# Patient Record
Sex: Female | Born: 1980 | Race: White | Hispanic: No | Marital: Married | Smoking: Current every day smoker
Health system: Southern US, Community
[De-identification: ages and names within clinical notes are randomized; demographics above are authoritative.]

## PROBLEM LIST (undated history)

## (undated) DIAGNOSIS — F1410 Cocaine abuse, uncomplicated: Secondary | ICD-10-CM

## (undated) DIAGNOSIS — F329 Major depressive disorder, single episode, unspecified: Secondary | ICD-10-CM

## (undated) DIAGNOSIS — F32A Depression, unspecified: Secondary | ICD-10-CM

## (undated) DIAGNOSIS — Z720 Tobacco use: Secondary | ICD-10-CM

## (undated) DIAGNOSIS — F419 Anxiety disorder, unspecified: Secondary | ICD-10-CM

## (undated) DIAGNOSIS — F1910 Other psychoactive substance abuse, uncomplicated: Secondary | ICD-10-CM

## (undated) DIAGNOSIS — Z8619 Personal history of other infectious and parasitic diseases: Secondary | ICD-10-CM

## (undated) HISTORY — DX: Tobacco use: Z72.0

## (undated) HISTORY — DX: Cocaine abuse, uncomplicated: F14.10

## (undated) HISTORY — DX: Other psychoactive substance abuse, uncomplicated: F19.10

## (undated) HISTORY — DX: Anxiety disorder, unspecified: F41.9

## (undated) HISTORY — DX: Major depressive disorder, single episode, unspecified: F32.9

## (undated) HISTORY — DX: Personal history of other infectious and parasitic diseases: Z86.19

## (undated) HISTORY — DX: Depression, unspecified: F32.A

---

## 2007-05-09 HISTORY — PX: AUGMENTATION MAMMAPLASTY: SUR837

## 2017-04-18 ENCOUNTER — Ambulatory Visit (INDEPENDENT_AMBULATORY_CARE_PROVIDER_SITE_OTHER): Admitting: Physician Assistant

## 2017-04-18 ENCOUNTER — Other Ambulatory Visit (HOSPITAL_COMMUNITY)
Admission: RE | Admit: 2017-04-18 | Discharge: 2017-04-18 | Disposition: A | Discharge status: Home / Self Care | Source: Ambulatory Visit | Attending: Physician Assistant | Admitting: Physician Assistant

## 2017-04-18 ENCOUNTER — Telehealth: Payer: Self-pay

## 2017-04-18 ENCOUNTER — Encounter: Payer: Self-pay | Admitting: Physician Assistant

## 2017-04-18 ENCOUNTER — Encounter (INDEPENDENT_AMBULATORY_CARE_PROVIDER_SITE_OTHER): Payer: Self-pay

## 2017-04-18 VITALS — BP 102/68 | HR 68 | Ht 61.0 in | Wt 124.0 lb

## 2017-04-18 DIAGNOSIS — F331 Major depressive disorder, recurrent, moderate: Secondary | ICD-10-CM

## 2017-04-18 DIAGNOSIS — Z87898 Personal history of other specified conditions: Secondary | ICD-10-CM | POA: Diagnosis not present

## 2017-04-18 DIAGNOSIS — Z202 Contact with and (suspected) exposure to infections with a predominantly sexual mode of transmission: Secondary | ICD-10-CM

## 2017-04-18 DIAGNOSIS — Z23 Encounter for immunization: Secondary | ICD-10-CM | POA: Diagnosis not present

## 2017-04-18 DIAGNOSIS — Z124 Encounter for screening for malignant neoplasm of cervix: Secondary | ICD-10-CM

## 2017-04-18 DIAGNOSIS — F1411 Cocaine abuse, in remission: Secondary | ICD-10-CM

## 2017-04-18 DIAGNOSIS — Z7721 Contact with and (suspected) exposure to potentially hazardous body fluids: Secondary | ICD-10-CM

## 2017-04-18 DIAGNOSIS — A599 Trichomoniasis, unspecified: Secondary | ICD-10-CM

## 2017-04-18 DIAGNOSIS — T7421XA Adult sexual abuse, confirmed, initial encounter: Secondary | ICD-10-CM

## 2017-04-18 DIAGNOSIS — Z3202 Encounter for pregnancy test, result negative: Secondary | ICD-10-CM

## 2017-04-18 DIAGNOSIS — F418 Other specified anxiety disorders: Secondary | ICD-10-CM

## 2017-04-18 DIAGNOSIS — Z8742 Personal history of other diseases of the female genital tract: Secondary | ICD-10-CM | POA: Insufficient documentation

## 2017-04-18 DIAGNOSIS — A749 Chlamydial infection, unspecified: Secondary | ICD-10-CM | POA: Diagnosis not present

## 2017-04-18 DIAGNOSIS — A568 Sexually transmitted chlamydial infection of other sites: Secondary | ICD-10-CM

## 2017-04-18 DIAGNOSIS — Z111 Encounter for screening for respiratory tuberculosis: Secondary | ICD-10-CM

## 2017-04-18 DIAGNOSIS — Z7689 Persons encountering health services in other specified circumstances: Secondary | ICD-10-CM

## 2017-04-18 DIAGNOSIS — R87619 Unspecified abnormal cytological findings in specimens from cervix uteri: Secondary | ICD-10-CM

## 2017-04-18 LAB — POCT URINE PREGNANCY: Preg Test, Ur: NEGATIVE

## 2017-04-18 MED ORDER — AZITHROMYCIN 1 G PO PACK: 1.0000 g | PACK | Freq: Once | ORAL | 0 refills | Status: DC

## 2017-04-18 MED ORDER — ESCITALOPRAM OXALATE 10 MG PO TABS: ORAL_TABLET | ORAL | 0 refills | Status: DC

## 2017-04-18 MED ORDER — HYDROXYZINE HCL 25 MG PO TABS: 25.0000 mg | ORAL_TABLET | Freq: Three times a day (TID) | ORAL | 0 refills | Status: DC | PRN

## 2017-04-18 MED ORDER — TUBERCULIN PPD 5 UNIT/0.1ML ID SOLN
5.0000 [IU] | Freq: Once | INTRADERMAL | Status: AC
  Administered 2017-04-18: 5 [IU] via INTRADERMAL

## 2017-04-18 MED ORDER — CEFTRIAXONE SODIUM 250 MG IJ SOLR: 250.0000 mg | Freq: Once | INTRAMUSCULAR | 0 refills | Status: DC

## 2017-04-18 MED ORDER — AZITHROMYCIN 250 MG PO TABS
1000.0000 mg | ORAL_TABLET | Freq: Once | ORAL | Status: AC
  Administered 2017-04-18: 1000 mg via ORAL

## 2017-04-18 MED ORDER — CEFTRIAXONE SODIUM 250 MG IJ SOLR
250.0000 mg | Freq: Once | INTRAMUSCULAR | Status: AC
  Administered 2017-04-18: 250 mg via INTRAMUSCULAR

## 2017-04-18 NOTE — Telephone Encounter (Signed)
[facility] Estelle Junehonda Jaquasia Doscher,CMA

## 2017-04-18 NOTE — Patient Instructions (Addendum)
  [facility] [address] [place], [place] [address] Administrative Line: [phone](336) [phone] or [phone](800) [phone] Crisis Line(s): [phone](336) [phone] (SA) or [phone](336) [phone] (DV) Fax: [phone](336) [phone] Website: http://[email or web address]  [place] [facility] [address] [place], [place] [address] Administrative Line: [phone](336) [phone] Crisis Line(s): [phone](336) [phone] Fax: [phone](336) [phone] Website: www.fspcares.Ronney Astersorg  National Sexual Assault Hotline Call 71242210491-(506)278-0039  Www.rainn.org  [facility] If you have a substance abuse disorder (with or without a mental health condition), you may benefit from specialized substance abuse therapy in a group setting. According to discharge survey data, 70 percent of patients completing our chemical dependency intensive outpatient program report fewer symptoms of substance abuse and incidents of relapse.  Meet with your peers every Monday, Wednesday and Friday from 1 to 4 p.m. for six to 10 weeks to:  Learn about chemical dependency, mental illness and co-occurring disorders. Develop relapse-prevention skills. Set personalized goals with your treatment team. To build on the skills you gain, you can attend Alcoholics Anonymous or Narcotics Anonymous meetings in the evenings and access follow-up care through weekly group meetings with peers. Ongoing support promotes wellness and recovery.  For more information, call [phone]. We work directly with employers and families to ensure you receive the care you need.

## 2017-04-18 NOTE — Telephone Encounter (Signed)
[facility] but they are not sure how long the back order will last. Left message that the [facility] [address]

## 2017-04-18 NOTE — Telephone Encounter (Signed)
Tell the patient to choose a different pharmacy
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
## 2017-04-18 NOTE — Telephone Encounter (Signed)
[facility] called and states the hydroxyzine is on manufacture back order and will not be able to get this medication for a while. Please advise.

## 2017-04-19 ENCOUNTER — Encounter: Payer: Self-pay | Admitting: Physician Assistant

## 2017-04-19 DIAGNOSIS — F17200 Nicotine dependence, unspecified, uncomplicated: Secondary | ICD-10-CM | POA: Insufficient documentation

## 2017-04-19 DIAGNOSIS — F331 Major depressive disorder, recurrent, moderate: Secondary | ICD-10-CM | POA: Insufficient documentation

## 2017-04-19 LAB — HIV ANTIBODY (ROUTINE TESTING W REFLEX): HIV 1&2 Ab, 4th Generation: NONREACTIVE

## 2017-04-19 LAB — HEPATITIS C ANTIBODY
HEP C AB: NONREACTIVE
SIGNAL TO CUT-OFF: 0.02 (ref <1.00)

## 2017-04-19 LAB — RPR: RPR: NONREACTIVE

## 2017-04-20 ENCOUNTER — Ambulatory Visit (INDEPENDENT_AMBULATORY_CARE_PROVIDER_SITE_OTHER): Admitting: Physician Assistant

## 2017-04-20 VITALS — BP 110/64 | HR 68 | Ht 61.0 in | Wt 124.0 lb

## 2017-04-20 DIAGNOSIS — Z111 Encounter for screening for respiratory tuberculosis: Secondary | ICD-10-CM

## 2017-04-20 LAB — CYTOLOGY - PAP
Chlamydia: POSITIVE — AB
Diagnosis: NEGATIVE
HPV (WINDOPATH): NOT DETECTED
Neisseria Gonorrhea: POSITIVE — AB

## 2017-04-20 LAB — READ PPD: TB SKIN TEST: NEGATIVE

## 2017-04-20 NOTE — Progress Notes (Signed)
Patient was in the office for PPD reading. There was no signs of irritation or raised bump at the injection site. Niamh Rada,CMA

## 2017-04-24 ENCOUNTER — Telehealth: Payer: Self-pay | Admitting: Physician Assistant

## 2017-04-24 ENCOUNTER — Ambulatory Visit

## 2017-04-24 DIAGNOSIS — F331 Major depressive disorder, recurrent, moderate: Secondary | ICD-10-CM

## 2017-04-24 DIAGNOSIS — F418 Other specified anxiety disorders: Secondary | ICD-10-CM

## 2017-04-24 MED ORDER — HYDROXYZINE HCL 25 MG PO TABS: 25.0000 mg | ORAL_TABLET | Freq: Three times a day (TID) | ORAL | 3 refills | Status: DC | PRN

## 2017-04-24 MED ORDER — ESCITALOPRAM OXALATE 20 MG PO TABS: 20.0000 mg | ORAL_TABLET | Freq: Every day | ORAL | 3 refills | Status: DC

## 2017-04-24 NOTE — Telephone Encounter (Signed)
Done

## 2017-04-24 NOTE — Telephone Encounter (Signed)
Rx given to Pt.

## 2017-04-24 NOTE — Telephone Encounter (Signed)
Pt is to start a drug rehab program tomorrow morning [facility] [place]. She is requesting a 4 month supply on her medications (lexapro,hydroxyzine), printed.   Called the facility, spoke with [place]. She talked with the intake representative and was advised they "could not confirm or deny if the Pt is coming in for treatment" but the Pt would need a printed 30 day Rx with enough refills to last 4 months.  Will route to Provider in office for review.

## 2017-04-25 DIAGNOSIS — A568 Sexually transmitted chlamydial infection of other sites: Secondary | ICD-10-CM | POA: Insufficient documentation

## 2017-04-25 DIAGNOSIS — Z202 Contact with and (suspected) exposure to infections with a predominantly sexual mode of transmission: Secondary | ICD-10-CM | POA: Insufficient documentation

## 2017-04-25 DIAGNOSIS — A599 Trichomoniasis, unspecified: Secondary | ICD-10-CM | POA: Insufficient documentation

## 2017-04-25 MED ORDER — METRONIDAZOLE 500 MG PO TABS: 500.0000 mg | ORAL_TABLET | Freq: Two times a day (BID) | ORAL | 0 refills | Status: DC

## 2017-04-25 NOTE — Progress Notes (Signed)
STI testing was positive for gonorrhea, chlamydia and trichomonas She received the treatment for gonorrhea and chlamydia She will need to take Metronidazole for the trichomonas. [facility]. She should return for a test of cure in 3 weeks  All other testing was negative Pap smear was normal. Recommend repeat Pap smear in 1 year

## 2017-04-25 NOTE — Addendum Note (Signed)
Addended by: Gena FrayUMMINGS, Jaritza Duignan E on: 04/25/2017 01:25 PM   Modules accepted: Orders

## 2017-04-26 ENCOUNTER — Other Ambulatory Visit: Payer: Self-pay | Admitting: *Deleted

## 2017-04-26 DIAGNOSIS — A599 Trichomoniasis, unspecified: Secondary | ICD-10-CM

## 2017-04-26 MED ORDER — METRONIDAZOLE 500 MG PO TABS: 500.0000 mg | ORAL_TABLET | Freq: Two times a day (BID) | ORAL | 0 refills | Status: AC

## 2017-05-02 ENCOUNTER — Other Ambulatory Visit: Payer: Self-pay | Admitting: Physician Assistant

## 2017-05-02 ENCOUNTER — Telehealth: Payer: Self-pay | Admitting: Physician Assistant

## 2017-05-02 DIAGNOSIS — F1990 Other psychoactive substance use, unspecified, uncomplicated: Secondary | ICD-10-CM

## 2017-05-02 NOTE — Telephone Encounter (Signed)
Please inform patient of the following inpatient rehab locations recommended by [facility]. I am happy to refer her to [facility] if she prefers, but wanted her to have this info.   [facility] [address], [place], [place] [phone]  [facility] [address], [place], [place] [phone]) [phone]  [facility] [address], [place], [place] [phone]) [phone]

## 2017-05-02 NOTE — Telephone Encounter (Signed)
Patient called to request a referral for inpatient rehabilitation treatment. Patient stated that the previous facility she chose was too far away and not covered by her insurance. Her insurance requires a referral for these services. She is requesting that a referral be placed to the [facility] at [address], [place], [place] [address] [phone]) [phone] as soon as possible. Please let me know if I can help in any way. Thanks!

## 2017-05-04 ENCOUNTER — Other Ambulatory Visit: Payer: Self-pay | Admitting: Physician Assistant

## 2017-05-04 DIAGNOSIS — F418 Other specified anxiety disorders: Secondary | ICD-10-CM

## 2017-05-09 NOTE — Telephone Encounter (Signed)
Patient has tricare insurance that requires referrals and the referrals have to come from [facility]. I tried to call patient on 05/02/17  but the only number listed is her sister's and she has not returned my phone call as of today. We are going to leave the referral open and see if we are able to contact the patient - CF

## 2017-05-15 ENCOUNTER — Other Ambulatory Visit: Payer: Self-pay | Admitting: Physician Assistant

## 2017-05-25 ENCOUNTER — Other Ambulatory Visit: Payer: Self-pay | Admitting: *Deleted

## 2017-05-25 DIAGNOSIS — N6310 Unspecified lump in the right breast, unspecified quadrant: Secondary | ICD-10-CM

## 2017-05-30 ENCOUNTER — Ambulatory Visit
Admission: RE | Admit: 2017-05-30 | Discharge: 2017-05-30 | Disposition: A | Discharge status: Home / Self Care | Source: Ambulatory Visit | Attending: *Deleted | Admitting: *Deleted

## 2017-05-30 DIAGNOSIS — N6310 Unspecified lump in the right breast, unspecified quadrant: Secondary | ICD-10-CM

## 2017-06-20 ENCOUNTER — Ambulatory Visit (INDEPENDENT_AMBULATORY_CARE_PROVIDER_SITE_OTHER): Admitting: Physician Assistant

## 2017-06-20 ENCOUNTER — Encounter: Payer: Self-pay | Admitting: Physician Assistant

## 2017-06-20 VITALS — BP 105/71 | HR 68 | Temp 98.0°F | Resp 16 | Wt 150.0 lb

## 2017-06-20 DIAGNOSIS — F419 Anxiety disorder, unspecified: Secondary | ICD-10-CM | POA: Diagnosis not present

## 2017-06-20 DIAGNOSIS — J00 Acute nasopharyngitis [common cold]: Secondary | ICD-10-CM | POA: Diagnosis not present

## 2017-06-20 DIAGNOSIS — F3341 Major depressive disorder, recurrent, in partial remission: Secondary | ICD-10-CM

## 2017-06-20 DIAGNOSIS — J22 Unspecified acute lower respiratory infection: Secondary | ICD-10-CM

## 2017-06-20 DIAGNOSIS — Z8619 Personal history of other infectious and parasitic diseases: Secondary | ICD-10-CM | POA: Diagnosis not present

## 2017-06-20 DIAGNOSIS — F418 Other specified anxiety disorders: Secondary | ICD-10-CM

## 2017-06-20 DIAGNOSIS — F5105 Insomnia due to other mental disorder: Secondary | ICD-10-CM

## 2017-06-20 LAB — POCT INFLUENZA A/B
INFLUENZA A, POC: NEGATIVE
Influenza B, POC: NEGATIVE

## 2017-06-20 MED ORDER — ESCITALOPRAM OXALATE 20 MG PO TABS: 20.0000 mg | ORAL_TABLET | Freq: Every day | ORAL | 3 refills | Status: AC

## 2017-06-20 MED ORDER — PREDNISONE 20 MG PO TABS: 40.0000 mg | ORAL_TABLET | Freq: Every day | ORAL | 0 refills | Status: AC

## 2017-06-20 MED ORDER — TRAZODONE HCL 100 MG PO TABS: 100.0000 mg | ORAL_TABLET | Freq: Every evening | ORAL | 2 refills | Status: AC | PRN

## 2017-06-20 MED ORDER — ALBUTEROL SULFATE HFA 108 (90 BASE) MCG/ACT IN AERS: 2.0000 | INHALATION_SPRAY | RESPIRATORY_TRACT | 1 refills | Status: AC | PRN

## 2017-06-20 MED ORDER — GUAIFENESIN ER 600 MG PO TB12: 1200.0000 mg | ORAL_TABLET | Freq: Two times a day (BID) | ORAL | Status: AC

## 2017-06-20 MED ORDER — IPRATROPIUM BROMIDE 0.06 % NA SOLN: 2.0000 | Freq: Four times a day (QID) | NASAL | 0 refills | Status: AC | PRN

## 2017-06-20 NOTE — Patient Instructions (Addendum)
For cough: - Prednisone daily for 5 days - Mucinex twice a day with plenty of water to make cough more productive - Do not suppress cough during the day.  - Okay to take Robitussin PM or Delsym PM at bedtime as needed for cough - Use Albuterol inhaler 2 puffs every 4-6 hours as needed for wheezing/shortness of breath - Drink at least 8-11 glasses of water per day - Reduce cigarettes   Steps to Quit Smoking Smoking tobacco can be bad for your health. It can also affect almost every organ in your body. Smoking puts you and people around you at risk for many serious long-lasting (chronic) diseases. Quitting smoking is hard, but it is one of the best things that you can do for your health. It is never too late to quit. What are the benefits of quitting smoking? When you quit smoking, you lower your risk for getting serious diseases and conditions. They can include:  Lung cancer or lung disease.  Heart disease.  Stroke.  Heart attack.  Not being able to have children (infertility).  Weak bones (osteoporosis) and broken bones (fractures).  If you have coughing, wheezing, and shortness of breath, those symptoms may get better when you quit. You may also get sick less often. If you are pregnant, quitting smoking can help to lower your chances of having a baby of low birth weight. What can I do to help me quit smoking? Talk with your doctor about what can help you quit smoking. Some things you can do (strategies) include:  Quitting smoking totally, instead of slowly cutting back how much you smoke over a period of time.  Going to in-person counseling. You are more likely to quit if you go to many counseling sessions.  Using resources and support systems, such as: ? Agricultural engineer with a Veterinary surgeon. ? Phone quitlines. ? Automotive engineer. ? Support groups or group counseling. ? Text messaging programs. ? Mobile phone apps or applications.  Taking medicines. Some of these  medicines may have nicotine in them. If you are pregnant or breastfeeding, do not take any medicines to quit smoking unless your doctor says it is okay. Talk with your doctor about counseling or other things that can help you.  Talk with your doctor about using more than one strategy at the same time, such as taking medicines while you are also going to in-person counseling. This can help make quitting easier. What things can I do to make it easier to quit? Quitting smoking might feel very hard at first, but there is a lot that you can do to make it easier. Take these steps:  Talk to your family and friends. Ask them to support and encourage you.  Call phone quitlines, reach out to support groups, or work with a Veterinary surgeon.  Ask people who smoke to not smoke around you.  Avoid places that make you want (trigger) to smoke, such as: ? Bars. ? Parties. ? Smoke-break areas at work.  Spend time with people who do not smoke.  Lower the stress in your life. Stress can make you want to smoke. Try these things to help your stress: ? Getting regular exercise. ? Deep-breathing exercises. ? Yoga. ? Meditating. ? Doing a body scan. To do this, close your eyes, focus on one area of your body at a time from head to toe, and notice which parts of your body are tense. Try to relax the muscles in those areas.  Download or buy apps  on your mobile phone or tablet that can help you stick to your quit plan. There are many free apps, such as QuitGuide from the [employer] developer for Disease Control and Prevention). You can find more support from smokefree.gov and other websites.  This information is not intended to replace advice given to you by your health care provider. Make sure you discuss any questions you have with your health care provider. Document Released: 02/18/2009 Document Revised: 12/21/2015 Document Reviewed: 09/08/2014 Elsevier Interactive Patient Education  2018 [employer].   Coping with  Quitting Smoking Quitting smoking is a physical and mental challenge. You will face cravings, withdrawal symptoms, and temptation. Before quitting, work with your health care provider to make a plan that can help you cope. Preparation can help you quit and keep you from giving in. How can I cope with cravings? Cravings usually last for 5-10 minutes. If you get through it, the craving will pass. Consider taking the following actions to help you cope with cravings:  Keep your mouth busy: ? Chew sugar-free gum. ? Suck on hard candies or a straw. ? Brush your teeth.  Keep your hands and body busy: ? Immediately change to a different activity when you feel a craving. ? Squeeze or play with a ball. ? Do an activity or a hobby, like making bead jewelry, practicing needlepoint, or working with wood. ? Mix up your normal routine. ? Take a short exercise break. Go for a quick walk or run up and down stairs. ? Spend time in public places where smoking is not allowed.  Focus on doing something kind or helpful for someone else.  Call a friend or family member to talk during a craving.  Join a support group.  Call a quit line, such as 1-800-QUIT-NOW.  Talk with your health care provider about medicines that might help you cope with cravings and make quitting easier for you.  How can I deal with withdrawal symptoms? Your body may experience negative effects as it tries to get used to not having nicotine in the system. These effects are called withdrawal symptoms. They may include:  Feeling hungrier than normal.  Trouble concentrating.  Irritability.  Trouble sleeping.  Feeling depressed.  Restlessness and agitation.  Craving a cigarette.  To manage withdrawal symptoms:  Avoid places, people, and activities that trigger your cravings.  Remember why you want to quit.  Get plenty of sleep.  Avoid coffee and other caffeinated drinks. These may worsen some of your symptoms.  How  can I handle social situations? Social situations can be difficult when you are quitting smoking, especially in the first few weeks. To manage this, you can:  Avoid parties, bars, and other social situations where people might be smoking.  Avoid alcohol.  Leave right away if you have the urge to smoke.  Explain to your family and friends that you are quitting smoking. Ask for understanding and support.  Plan activities with friends or family where smoking is not an option.  What are some ways I can cope with stress? Wanting to smoke may cause stress, and stress can make you want to smoke. Find ways to manage your stress. Relaxation techniques can help. For example:  Breathe slowly and deeply, in through your nose and out through your mouth.  Listen to soothing, relaxing music.  Talk with a family member or friend about your stress.  Light a candle.  Soak in a bath or take a shower.  Think about a peaceful  place.  What are some ways I can prevent weight gain? Be aware that many people gain weight after they quit smoking. However, not everyone does. To keep from gaining weight, have a plan in place before you quit and stick to the plan after you quit. Your plan should include:  Having healthy snacks. When you have a craving, it may help to: ? Eat plain popcorn, crunchy carrots, celery, or other cut vegetables. ? Chew sugar-free gum.  Changing how you eat: ? Eat small portion sizes at meals. ? Eat 4-6 small meals throughout the day instead of 1-2 large meals a day. ? Be mindful when you eat. Do not watch television or do other things that might distract you as you eat.  Exercising regularly: ? Make time to exercise each day. If you do not have time for a long workout, do short bouts of exercise for 5-10 minutes several times a day. ? Do some form of strengthening exercise, like weight lifting, and some form of aerobic exercise, like running or swimming.  Drinking plenty of  water or other low-calorie or no-calorie drinks. Drink 6-8 glasses of water daily, or as much as instructed by your health care provider.  Summary  Quitting smoking is a physical and mental challenge. You will face cravings, withdrawal symptoms, and temptation to smoke again. Preparation can help you as you go through these challenges.  You can cope with cravings by keeping your mouth busy (such as by chewing gum), keeping your body and hands busy, and making calls to family, friends, or a helpline for people who want to quit smoking.  You can cope with withdrawal symptoms by avoiding places where people smoke, avoiding drinks with caffeine, and getting plenty of rest.  Ask your health care provider about the different ways to prevent weight gain, avoid stress, and handle social situations. This information is not intended to replace advice given to you by your health care provider. Make sure you discuss any questions you have with your health care provider. Document Released: 04/21/2016 Document Revised: 04/21/2016 Document Reviewed: 04/21/2016 Elsevier Interactive Patient Education  [employer]2018 Elsevier Inc.

## 2017-06-20 NOTE — Progress Notes (Signed)
HPI:                                                                Karen Bowen is a 37 y.o. female who presents to [facility] Karen Bowen: Primary Care Sports Medicine today for URI symptoms  URI   This is a new problem. The current episode started in the past 7 days. The problem has been gradually improving. There has been no fever. Associated symptoms include congestion, coughing and rhinorrhea.  Reduced cigarettes to 1/2 ppd and started vaping about 1 week ago. Sick contacts include sister and nephew.  She is also requesting refills of her Trazodone and Lexapro. She is in an IOP program for drug abuse 3 days per week and is doing extremely well. She has been sober for 68 days.   She is also due for test of cure for gonorrhea and chlamydia. She completed treatment. She has not been sexually active since prior to her treatment.  Depression screen [facility] 2/9 06/20/2017 04/18/2017  Decreased Interest 0 2  Down, Depressed, Hopeless 0 3  PHQ - 2 Score 0 5  Altered sleeping 0 3  Tired, decreased energy 1 2  Change in appetite 0 0  Feeling bad or failure about yourself  0 3  Trouble concentrating 0 2  Moving slowly or fidgety/restless 0 1  Suicidal thoughts - 0  PHQ-9 Score 1 16  Difficult doing work/chores Not difficult at all -    GAD 7 : Generalized Anxiety Score 06/20/2017  Nervous, Anxious, on Edge 0  Control/stop worrying 1  Worry too much - different things 1  Trouble relaxing 0  Restless 0  Easily annoyed or irritable 0  Afraid - awful might happen 1  Total GAD 7 Score 3  Anxiety Difficulty Not difficult at all      Past Medical History:  Diagnosis Date  . Anxiety   . Cocaine abuse (HCC)   . Depression   . Drug abuse (HCC)   . History of gonorrhea   . Tobacco use    Past Surgical History:  Procedure Laterality Date  . AUGMENTATION MAMMAPLASTY Bilateral 2009   Social History   Tobacco Use  . Smoking status: Current Every Day Smoker    Packs/day:  1.00    Years: 4.00    Pack years: 4.00    Types: Cigarettes, E-cigarettes  . Smokeless tobacco: Current User  Substance Use Topics  . Alcohol use: No    Frequency: Never   family history includes Alcohol abuse in her mother; Depression in her mother; Hypertension in her mother.    ROS: negative except as noted in the HPI  Medications: Current Outpatient Medications  Medication Sig Dispense Refill  . albuterol (PROVENTIL HFA;VENTOLIN HFA) 108 (90 Base) MCG/ACT inhaler Inhale 2 puffs into the lungs every 4 (four) hours as needed for wheezing. 1 Inhaler 1  . escitalopram (LEXAPRO) 20 MG tablet Take 1 tablet (20 mg total) by mouth daily. 30 tablet 3  . guaiFENesin (MUCINEX) 600 MG 12 hr tablet Take 2 tablets (1,200 mg total) by mouth 2 (two) times daily.    Marland Kitchen ipratropium (ATROVENT) 0.06 % nasal spray Place 2 sprays into both nostrils 4 (four) times daily as needed. 15 mL 0  . predniSONE (  DELTASONE) 20 MG tablet Take 2 tablets (40 mg total) by mouth daily with breakfast for 5 days. 10 tablet 0  . traZODone (DESYREL) 100 MG tablet Take 1 tablet (100 mg total) by mouth at bedtime as needed for sleep. 30 tablet 2   No current facility-administered medications for this visit.    No Known Allergies     Objective:  BP 105/71   Pulse 68   Temp 98 F (36.7 C) (Oral)   Wt 150 lb (68 kg)   SpO2 95%   BMI 28.34 kg/m  Gen:  alert, ill-appearing, not toxic-appearing, no distress, appropriate for age HEENT: head normocephalic without obvious abnormality, conjunctiva and cornea clear, left ear canal obstructed by cerumen, right TM clear, nasal mucosa edematous with clear rhinorrhea, oropharynx clear, no edema, uvula midline, neck supple, no cervical adenopathy, trachea midline Pulm: Normal work of breathing, normal phonation, clear to auscultation bilaterally, no wheezes, rales or rhonchi CV: Normal rate, regular rhythm, s1 and s2 distinct, no murmurs, clicks or rubs  Neuro: alert and  oriented x 3, no tremor MSK: extremities atraumatic, normal gait and station Skin: intact, no rashes on exposed skin, no jaundice, no cyanosis Psych: well-groomed, cooperative, good eye contact, euthymic mood, affect mood-congruent, speech is articulate, and thought processes clear and goal-directed  No results found for this or any previous visit (from the past 72 hour(s)). No results found.    Assessment and Plan: 37 y.o. female with   1. Acute lower respiratory infection - SpO2 95% on RA at rest, current smoker, no evidence of pneumonia or respiratory distress - symptomatic care with steroid burst, bronchodilator, and expectorant - predniSONE (DELTASONE) 20 MG tablet; Take 2 tablets (40 mg total) by mouth daily with breakfast for 5 days.  Dispense: 10 tablet; Refill: 0 - albuterol (PROVENTIL HFA;VENTOLIN HFA) 108 (90 Base) MCG/ACT inhaler; Inhale 2 puffs into the lungs every 4 (four) hours as needed for wheezing.  Dispense: 1 Inhaler; Refill: 1 - guaiFENesin (MUCINEX) 600 MG 12 hr tablet; Take 2 tablets (1,200 mg total) by mouth 2 (two) times daily.  2. Acute rhinitis - ipratropium (ATROVENT) 0.06 % nasal spray; Place 2 sprays into both nostrils 4 (four) times daily as needed.  Dispense: 15 mL; Refill: 0  3. Recurrent major depressive disorder, in partial remission (HCC) - PHQ2=0, PHQ9=1 - escitalopram (LEXAPRO) 20 MG tablet; Take 1 tablet (20 mg total) by mouth daily.  Dispense: 30 tablet; Refill: 3  4. Insomnia secondary to anxiety - traZODone (DESYREL) 100 MG tablet; Take 1 tablet (100 mg total) by mouth at bedtime as needed for sleep.  Dispense: 30 tablet; Refill: 2  5. Anxiety with depression - GAD7=3 - well-controlled on Lexapro. Follow-up in 3 months - escitalopram (LEXAPRO) 20 MG tablet; Take 1 tablet (20 mg total) by mouth daily.  Dispense: 30 tablet; Refill: 3  6. History of sexually transmitted disease - test of cure pending - Trichomonas vaginalis, RNA - C.  trachomatis/N. gonorrhoeae RNA   Patient education and anticipatory guidance given Patient agrees with treatment plan Follow-up as needed if symptoms worsen or fail to improve  Levonne Hubertharley E. Denelle Capurro PA-C

## 2017-06-21 LAB — C. TRACHOMATIS/N. GONORRHOEAE RNA
C. trachomatis RNA, TMA: NOT DETECTED
N. gonorrhoeae RNA, TMA: NOT DETECTED

## 2017-06-21 LAB — TRICHOMONAS VAGINALIS, PROBE AMP: TRICHOMONAS VAGINALIS RNA: NOT DETECTED

## 2017-06-27 ENCOUNTER — Other Ambulatory Visit: Payer: Self-pay | Admitting: Physician Assistant

## 2017-06-27 DIAGNOSIS — J00 Acute nasopharyngitis [common cold]: Secondary | ICD-10-CM

## 2017-07-03 ENCOUNTER — Ambulatory Visit (INDEPENDENT_AMBULATORY_CARE_PROVIDER_SITE_OTHER)

## 2017-07-03 ENCOUNTER — Encounter: Payer: Self-pay | Admitting: Sports Medicine

## 2017-07-03 ENCOUNTER — Ambulatory Visit (INDEPENDENT_AMBULATORY_CARE_PROVIDER_SITE_OTHER): Admitting: Sports Medicine

## 2017-07-03 DIAGNOSIS — M79671 Pain in right foot: Secondary | ICD-10-CM | POA: Insufficient documentation

## 2017-07-03 DIAGNOSIS — G8929 Other chronic pain: Secondary | ICD-10-CM

## 2017-07-03 DIAGNOSIS — M79674 Pain in right toe(s): Secondary | ICD-10-CM | POA: Diagnosis not present

## 2017-07-03 DIAGNOSIS — M542 Cervicalgia: Secondary | ICD-10-CM

## 2017-07-03 DIAGNOSIS — M50322 Other cervical disc degeneration at C5-C6 level: Secondary | ICD-10-CM | POA: Diagnosis not present

## 2017-07-03 MED ORDER — MELOXICAM 15 MG PO TABS: ORAL_TABLET | ORAL | 3 refills | Status: AC

## 2017-07-03 NOTE — Progress Notes (Signed)
Subjective:    I'm seeing this patient as a consultation for: Gena Frayharley Cummings, PA-C  CC: Neck pain, foot pain  HPI: This is a pleasant 37 year old female, about 2 weeks ago a shelf fell on her head, she ended up rolling her right foot as well.  She has pain in her neck, left-sided, overall improving.  She did have significant swelling and bruising in her foot, over the great toe on the right, first MTP.  The swelling and bruising went away but she continued to have pain at the dorsal first MTP, moderate, persistent without radiation.  I reviewed the past medical history, family history, social history, surgical history, and allergies today and no changes were needed.  Please see the problem list section below in epic for further details.  Past Medical History: Past Medical History:  Diagnosis Date  . Anxiety   . Cocaine abuse (HCC)   . Depression   . Drug abuse (HCC)   . History of gonorrhea   . Tobacco use    Past Surgical History: Past Surgical History:  Procedure Laterality Date  . AUGMENTATION MAMMAPLASTY Bilateral 2009   Social History: Social History   Socioeconomic History  . Marital status: Married    Spouse name: None  . Number of children: None  . Years of education: None  . Highest education level: None  Social Needs  . Financial resource strain: None  . Food insecurity - worry: None  . Food insecurity - inability: None  . Transportation needs - medical: None  . Transportation needs - non-medical: None  Occupational History  . None  Tobacco Use  . Smoking status: Current Every Day Smoker    Packs/day: 1.00    Years: 4.00    Pack years: 4.00    Types: Cigarettes, E-cigarettes  . Smokeless tobacco: Current User  Substance and Sexual Activity  . Alcohol use: No    Frequency: Never  . Drug use: Yes    Types: "Crack" cocaine    Comment: last use 04/13/2017  . Sexual activity: Not Currently    Birth control/protection: None  Other Topics Concern  .  None  Social History Narrative  . None   Family History: Family History  Problem Relation Age of Onset  . Alcohol abuse Mother   . Depression Mother   . Hypertension Mother    Allergies: No Known Allergies Medications: See med rec.  Review of Systems: No headache, visual changes, nausea, vomiting, diarrhea, constipation, dizziness, abdominal pain, skin rash, fevers, chills, night sweats, weight loss, swollen lymph nodes, body aches, joint swelling, muscle aches, chest pain, shortness of breath, mood changes, visual or auditory hallucinations.   Objective:   General: Well Developed, well nourished, and in no acute distress.  Neuro:  Extra-ocular muscles intact, able to move all 4 extremities, sensation grossly intact.  Deep tendon reflexes tested were normal. Psych: Alert and oriented, mood congruent with affect. ENT:  Ears and nose appear unremarkable.  Hearing grossly normal. Neck: Unremarkable overall appearance, trachea midline.  No visible thyroid enlargement. Eyes: Conjunctivae and lids appear unremarkable.  Pupils equal and round. Skin: Warm and dry, no rashes noted.  Cardiovascular: Pulses palpable, no extremity edema. Neck: Negative spurling's Full neck range of motion Grip strength and sensation normal in bilateral hands Strength good C4 to T1 distribution No sensory change to C4 to T1 Reflexes normal Right foot: No visible erythema or swelling. Range of motion is full in all directions. Strength is 5/5 in all directions.  No hallux valgus. No pes cavus or pes planus. No abnormal callus noted. No pain over the navicular prominence, or base of fifth metatarsal. No tenderness to palpation of the calcaneal insertion of plantar fascia. No pain at the Achilles insertion. No pain over the calcaneal bursa. No pain of the retrocalcaneal bursa. No tenderness to palpation over the tarsals, metatarsals, or phalanges. No hallux rigidus or limitus. Tender to palpation over  the dorsum of the first MTP No pain with compression of the metatarsal heads. Neurovascularly intact distally.   The first and second toes were buddy taped together.  Impression and Recommendations:   This case required medical decision making of moderate complexity.  Neck pain, chronic X-rays, rehab exercises.  Right foot pain I think this is more simply contusion. X-rays of the right great toe. Meloxicam. Rigid soled shoes for now, Buddy tape the first and second toes together. Return to see me in 2 weeks, we will consider a first MTP injection if no better. __________________________________________ Ihor Austin. Benjamin Stain, M.D., ABFM., CAQSM. Primary Care and Sports Medicine [facility]  Adjunct Instructor of Family Medicine  [employer]

## 2017-07-03 NOTE — Assessment & Plan Note (Signed)
X-rays, rehab exercises. 

## 2017-07-03 NOTE — Assessment & Plan Note (Signed)
I think this is more simply contusion. X-rays of the right great toe. Meloxicam. Rigid soled shoes for now, Buddy tape the first and second toes together. Return to see me in 2 weeks, we will consider a first MTP injection if no better.

## 2018-08-25 IMAGING — DX DG TOE GREAT 2+V*R*
4 series · 4 of 4 positions shown · non-contrast
Comparison: None.

CLINICAL DATA: Pain in the first MTP joint after an object fell on
the great toe 1 and half weeks ago

EXAM:
RIGHT GREAT TOE

[toe ap]
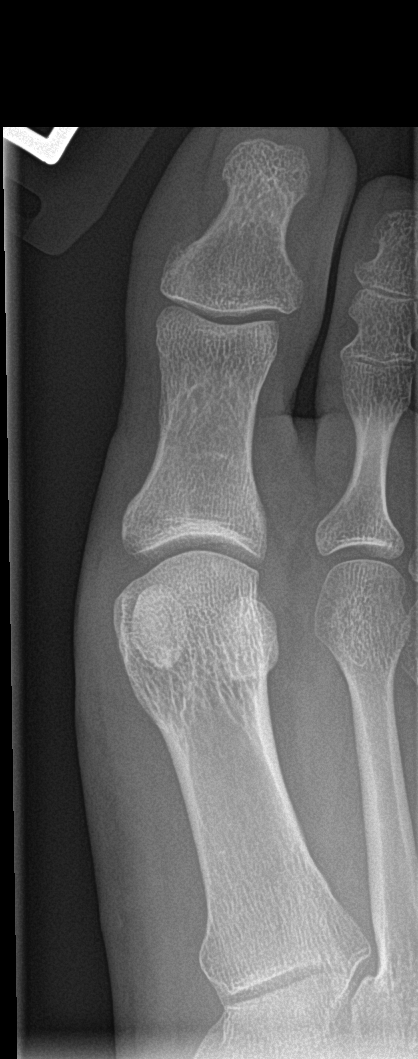

[toe obl]
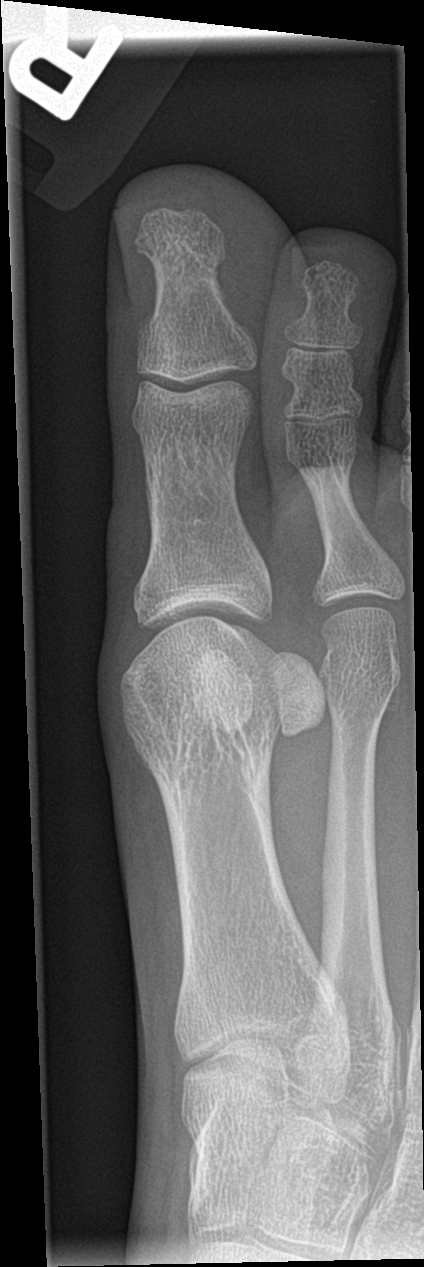

[toe lat (1 of 2)]
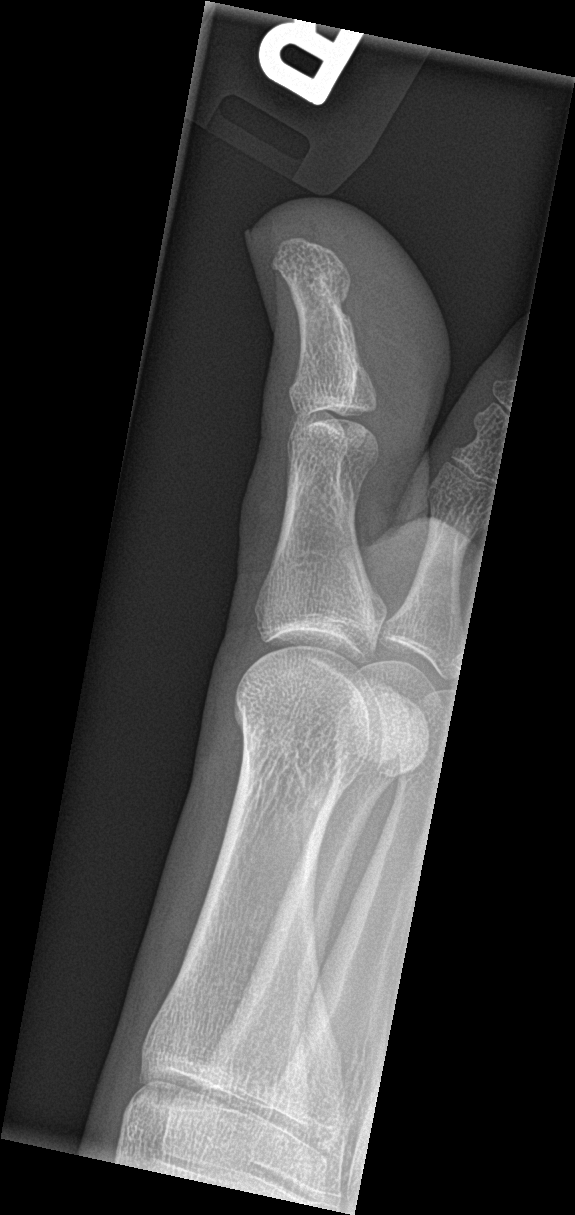

[toe lat (2 of 2)]
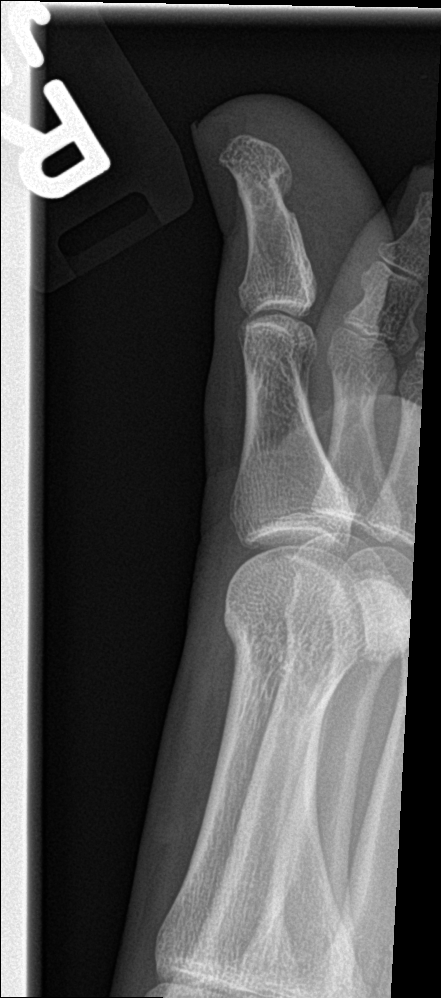

[4 of 4 positions shown; findings below may reference images not displayed]

FINDINGS: Alignment is normal. Joint spaces appear normal. No acute fracture
is seen. No erosion is noted.
IMPRESSION: Negative.
